# Patient Record
Sex: Male | Born: 1975 | Race: Black or African American | Hispanic: No | Marital: Married | Smoking: Current every day smoker
Health system: Southern US, Community
[De-identification: ages and names within clinical notes are randomized; demographics above are authoritative.]

## PROBLEM LIST (undated history)

## (undated) DIAGNOSIS — E7800 Pure hypercholesterolemia, unspecified: Secondary | ICD-10-CM

## (undated) DIAGNOSIS — E119 Type 2 diabetes mellitus without complications: Secondary | ICD-10-CM

---

## 2011-11-07 ENCOUNTER — Emergency Department (HOSPITAL_BASED_OUTPATIENT_CLINIC_OR_DEPARTMENT_OTHER)
Admission: EM | Admit: 2011-11-07 | Discharge: 2011-11-07 | Disposition: A | Discharge status: Home / Self Care | Payer: Medicaid Other | Attending: Emergency Medicine | Admitting: Emergency Medicine

## 2011-11-07 ENCOUNTER — Encounter (HOSPITAL_BASED_OUTPATIENT_CLINIC_OR_DEPARTMENT_OTHER): Payer: Self-pay | Admitting: *Deleted

## 2011-11-07 ENCOUNTER — Emergency Department (INDEPENDENT_AMBULATORY_CARE_PROVIDER_SITE_OTHER): Payer: Medicaid Other

## 2011-11-07 DIAGNOSIS — M545 Low back pain, unspecified: Secondary | ICD-10-CM | POA: Insufficient documentation

## 2011-11-07 DIAGNOSIS — IMO0001 Reserved for inherently not codable concepts without codable children: Secondary | ICD-10-CM | POA: Insufficient documentation

## 2011-11-07 DIAGNOSIS — M549 Dorsalgia, unspecified: Secondary | ICD-10-CM

## 2011-11-07 DIAGNOSIS — R109 Unspecified abdominal pain: Secondary | ICD-10-CM

## 2011-11-07 DIAGNOSIS — M533 Sacrococcygeal disorders, not elsewhere classified: Secondary | ICD-10-CM | POA: Insufficient documentation

## 2011-11-07 DIAGNOSIS — N509 Disorder of male genital organs, unspecified: Secondary | ICD-10-CM

## 2011-11-07 DIAGNOSIS — R209 Unspecified disturbances of skin sensation: Secondary | ICD-10-CM

## 2011-11-07 LAB — URINALYSIS, ROUTINE W REFLEX MICROSCOPIC
Leukocytes, UA: NEGATIVE
Nitrite: NEGATIVE
Specific Gravity, Urine: 1.024 (ref 1.005–1.030)
pH: 5.5 (ref 5.0–8.0)

## 2011-11-07 LAB — URINE MICROSCOPIC-ADD ON

## 2011-11-07 MED ORDER — IBUPROFEN 800 MG PO TABS
800.0000 mg | ORAL_TABLET | Freq: Once | ORAL | Status: AC
  Administered 2011-11-07: 800 mg via ORAL
  Filled 2011-11-07: qty 1

## 2011-11-07 NOTE — ED Notes (Signed)
Pt. Reports urinating more frequently and pain in the L lower quadrant and L lower back.  Pt. Also reports tingling in the scrotal area.

## 2011-11-07 NOTE — Discharge Instructions (Signed)
Back Pain, Adult Low back pain is very common. About 1 in 5 people have back pain.The cause of low back pain is rarely dangerous. The pain often gets better over time.About half of people with a sudden onset of back pain feel better in just 2 weeks. About 8 in 10 people feel better by 6 weeks.  CAUSES Some common causes of back pain include:  Strain of the muscles or ligaments supporting the spine.   Wear and tear (degeneration) of the spinal discs.   Arthritis.   Direct injury to the back.  DIAGNOSIS Most of the time, the direct cause of low back pain is not known.However, back pain can be treated effectively even when the exact cause of the pain is unknown.Answering your caregiver's questions about your overall health and symptoms is one of the most accurate ways to make sure the cause of your pain is not dangerous. If your caregiver needs more information, he or she may order lab work or imaging tests (X-rays or MRIs).However, even if imaging tests show changes in your back, this usually does not require surgery. HOME CARE INSTRUCTIONS For many people, back pain returns.Since low back pain is rarely dangerous, it is often a condition that people can learn to manageon their own.   Remain active. It is stressful on the back to sit or stand in one place. Do not sit, drive, or stand in one place for more than 30 minutes at a time. Take short walks on level surfaces as soon as pain allows.Try to increase the length of time you walk each day.   Do not stay in bed.Resting more than 1 or 2 days can delay your recovery.   Do not avoid exercise or work.Your body is made to move.It is not dangerous to be active, even though your back may hurt.Your back will likely heal faster if you return to being active before your pain is gone.   Pay attention to your body when you bend and lift. Many people have less discomfortwhen lifting if they bend their knees, keep the load close to their  bodies,and avoid twisting. Often, the most comfortable positions are those that put less stress on your recovering back.   Find a comfortable position to sleep. Use a firm mattress and lie on your side with your knees slightly bent. If you lie on your back, put a pillow under your knees.   Only take over-the-counter or prescription medicines as directed by your caregiver. Over-the-counter medicines to reduce pain and inflammation are often the most helpful.Your caregiver may prescribe muscle relaxant drugs.These medicines help dull your pain so you can more quickly return to your normal activities and healthy exercise.   Put ice on the injured area.   Put ice in a plastic bag.   Place a towel between your skin and the bag.   Leave the ice on for 15 to 20 minutes, 3 to 4 times a day for the first 2 to 3 days. After that, ice and heat may be alternated to reduce pain and spasms.   Ask your caregiver about trying back exercises and gentle massage. This may be of some benefit.   Avoid feeling anxious or stressed.Stress increases muscle tension and can worsen back pain.It is important to recognize when you are anxious or stressed and learn ways to manage it.Exercise is a great option.  SEEK MEDICAL CARE IF:  You have pain that is not relieved with rest or medicine.   You have   pain that does not improve in 1 week.   You have new symptoms.   You are generally not feeling well.  SEEK IMMEDIATE MEDICAL CARE IF:   You have pain that radiates from your back into your legs.   You develop new bowel or bladder control problems.   You have unusual weakness or numbness in your arms or legs.   You develop nausea or vomiting.   You develop abdominal pain.   You feel faint.  Document Released: 07/24/2005 Document Revised: 07/13/2011 Document Reviewed: 12/12/2010 ExitCare Patient Information 2012 [employer].     RESOURCE GUIDE  Dental Problems  Patients with  Medicaid: [facility]                     [facility] [address]                                           [address] Phone:  [phone]                                                  Phone:  [phone]  If unable to pay or uninsured, contact:  [facility] or [facility] to become qualified for the adult dental clinic.  Chronic Pain Problems Contact [facility]  [phone] Patients need to be referred by their primary care doctor.  Insufficient Money for Medicine Contact [employer]:  call "211" or [facility] 271-5999.  No Primary Care Doctor Call [facility]  832-8000 Other agencies that provide inexpensive medical care    [facility]  832-8035    [facility]  832-7272    [facility]  [phone]    Women[facility] Clinic  832-4777    [facility]  [phone]    [facility]  [phone]  [facility]ological Services [facility]  832-9600 [facility]  [phone] [facility]   800 853-5163 (emergency services 641-4993)  Substance Abuse Resources [facility]  336-882-2125 [facility] 336-784-9470 [facility] 336-285-9073 [facility] 336-845-3988 Residential & Outpatient Substance Abuse Program  800-659-3381  Abuse/Neglect [place] Child Abuse Hotline (336) 641-3795 [place] [facility] Abuse Hotline 800-378-5315 (After Hours)  Emergency Shelter  [facility] (336) 271-5985  [facility] (336) 275-9566 [facility] (704) 372-4663  MRSA Hotline #:   832-7006    [place] Resources  [facility] [place]     [employer]                          [facility] [address] [place]                       [address]      [address]  [place]                                                   [place]                            [place] Phone:  [phone]                                   Phone:  [phone]                 Phone:  [phone]  [facility] Phone:  [phone]  [place] Child Abuse Hotline (336) 342-1394 (336) 342-3537 (After Hours)   

## 2011-11-08 NOTE — ED Provider Notes (Signed)
History     CSN: [id number]  Arrival date & time 11/07/11  2058   First MD Initiated Contact with Patient 11/07/11 2131      Chief Complaint  Patient presents with  . Back Pain    For 3 days Pt. reports pain and tingling in the scrotal area.  Pt. reports the back pain in on the R side lower and into the R flank area.    (Consider location/radiation/quality/duration/timing/severity/associated sxs/prior treatment) HPI Comments: R lower back pain x 2months that has been intermittent.  NO injury.  No radiation of pain.  Occasionaly feels "tingling" in testicles, none at this time.  No fever, vomiting, dysuria, hematuria. No weakness, numbness, tingling, bowel or bladder incontinence. No abdominal pain.  Patient is a 36 y.o. male presenting with back pain.  Back Pain  This is a chronic problem. The current episode started more than 1 week ago. The problem occurs constantly. The problem has not changed since onset.The pain is associated with no known injury. The pain is present in the sacro-iliac joint and gluteal region. The quality of the pain is described as aching. The pain does not radiate. The pain is moderate. The symptoms are aggravated by bending and twisting. The pain is the same all the time. Associated symptoms include numbness and tingling. Pertinent negatives include no chest pain, no fever, no headaches, no abdominal pain, no bowel incontinence, no perianal numbness, no bladder incontinence and no weakness. He has tried NSAIDs for the symptoms. Risk factors include obesity, lack of exercise and a sedentary lifestyle.    History reviewed. No pertinent past medical history.  History reviewed. No pertinent past surgical history.  No family history on file.  History  Substance Use Topics  . Smoking status: Former Games developer  . Smokeless tobacco: Not on file  . Alcohol Use: No      Review of Systems  Constitutional: Negative for fever and appetite change.  HENT: Negative for  rhinorrhea.   Respiratory: Negative for cough.   Cardiovascular: Negative for chest pain.  Gastrointestinal: Negative for nausea, vomiting, abdominal pain and bowel incontinence.  Genitourinary: Positive for flank pain. Negative for bladder incontinence, discharge and testicular pain.  Musculoskeletal: Positive for back pain.  Neurological: Positive for tingling and numbness. Negative for weakness and headaches.    Allergies  Review of patient's allergies indicates no known allergies.  Home Medications   Current Outpatient Rx  Name Route Sig Dispense Refill  . IBUPROFEN 200 MG PO TABS Oral Take 1,200 mg by mouth once as needed. For pain      BP 148/87  Pulse 83  Temp(Src) 98.1 F (36.7 C) (Oral)  Resp 20  Ht 5\' 7"  (1.702 m)  Wt 380 lb (172.367 kg)  BMI 59.52 kg/m2  SpO2 98%  Physical Exam  Constitutional: He is oriented to person, place, and time. He appears well-developed and well-nourished. No distress.       Morbidly obese  HENT:  Head: Normocephalic and atraumatic.  Mouth/Throat: Oropharynx is clear and moist. No oropharyngeal exudate.  Eyes: Conjunctivae are normal. Pupils are equal, round, and reactive to light.  Neck: Normal range of motion.  Cardiovascular: Normal rate and normal heart sounds.   Pulmonary/Chest: Effort normal and breath sounds normal. No respiratory distress.       Diminished breath sounds due to body habitus  Abdominal: Soft. There is no tenderness. There is no rebound and no guarding.  Genitourinary: Penis normal.       No  testicular pain  Musculoskeletal: He exhibits tenderness.       R SI tenderness and paraspinal lumbar tenderness  Neurological: He is alert and oriented to person, place, and time. No cranial nerve deficit.       5/5 strength in bilateral lower extremities.  +2 patellar reflexes.  Ankle dorsi and plantar flexion intact.  Great toes dorsiflexion intact bilatereally.  Skin: Skin is warm.    ED Course  Procedures (including  critical care time)  Labs Reviewed  URINALYSIS, ROUTINE W REFLEX MICROSCOPIC - Abnormal; Notable for the following:    Hgb urine dipstick TRACE (*)    All other components within normal limits  URINE MICROSCOPIC-ADD ON - Abnormal; Notable for the following:    Bacteria, UA FEW (*)    All other components within normal limits   Ct Abdomen Pelvis Wo Contrast  11/07/2011  *RADIOLOGY REPORT*  Clinical Data: Back and right flank pain.  CT ABDOMEN AND PELVIS WITHOUT CONTRAST  Technique:  Multidetector CT imaging of the abdomen and pelvis was performed following the standard protocol without intravenous contrast.  Comparison: None  Findings: The lung bases are clear.  No pleural effusion.  Examination is limited by morbid obesity.  There is severe diffuse fatty infiltration of the liver with minimal fatty sparing around the gallbladder. No obvious hepatic lesion or intrahepatic biliary dilatation.  The gallbladder is grossly normal.  No obvious common bile duct dilatation.  The pancreas is grossly normal.  The spleen is normal in size.  No definite lesions.  There is a small right adrenal gland lesion which measures 8 HU and is consistent with a benign adenoma.  No renal or obstructing ureteral calculi.  No obvious renal mass.  The stomach, duodenum, small bowel and colon are grossly normal without oral contrast.  No obvious inflammatory changes or mass lesions.  No mesenteric or retroperitoneal mass or adenopathy.  The aorta is normal in caliber.  The bladder, prostate gland and seminal vesicles are unremarkable. No inguinal mass or adenopathy.  The bony pelvis is intact.  The pubic symphysis and SI joints are intact.  The lumbar vertebral bodies are normally aligned.  The facets are maintained.  No pars defects.  IMPRESSION:  No acute abdominal/pelvic findings.  No renal or obstructing ureteral calculi.  Original Report Authenticated By: P. Loralie Champagne, M.D.     1. Back pain       MDM  Chronic back  pain without neurological deficit.  Occasional testicular "tingling".  Normal testicular exam. No evidence of torsion.  UA with trace hb.  No kidney stones seen on CT. Supportive care for back pain.  Establish care with PCP.       Glynn Octave, MD 11/08/11 1030

## 2016-11-19 ENCOUNTER — Encounter (HOSPITAL_BASED_OUTPATIENT_CLINIC_OR_DEPARTMENT_OTHER): Payer: Self-pay | Admitting: *Deleted

## 2016-11-19 ENCOUNTER — Emergency Department (HOSPITAL_BASED_OUTPATIENT_CLINIC_OR_DEPARTMENT_OTHER)
Admission: EM | Admit: 2016-11-19 | Discharge: 2016-11-19 | Disposition: A | Discharge status: Home / Self Care | Payer: Medicaid Other | Attending: Emergency Medicine | Admitting: Emergency Medicine

## 2016-11-19 ENCOUNTER — Emergency Department (HOSPITAL_BASED_OUTPATIENT_CLINIC_OR_DEPARTMENT_OTHER): Payer: Medicaid Other

## 2016-11-19 DIAGNOSIS — R062 Wheezing: Secondary | ICD-10-CM | POA: Insufficient documentation

## 2016-11-19 DIAGNOSIS — Z5321 Procedure and treatment not carried out due to patient leaving prior to being seen by health care provider: Secondary | ICD-10-CM | POA: Diagnosis not present

## 2016-11-19 DIAGNOSIS — R05 Cough: Secondary | ICD-10-CM | POA: Diagnosis not present

## 2016-11-19 DIAGNOSIS — R0602 Shortness of breath: Secondary | ICD-10-CM | POA: Diagnosis not present

## 2016-11-19 DIAGNOSIS — R0981 Nasal congestion: Secondary | ICD-10-CM | POA: Insufficient documentation

## 2016-11-19 DIAGNOSIS — J029 Acute pharyngitis, unspecified: Secondary | ICD-10-CM | POA: Insufficient documentation

## 2016-11-19 DIAGNOSIS — F17200 Nicotine dependence, unspecified, uncomplicated: Secondary | ICD-10-CM | POA: Diagnosis not present

## 2016-11-19 DIAGNOSIS — R059 Cough, unspecified: Secondary | ICD-10-CM

## 2016-11-19 HISTORY — DX: Pure hypercholesterolemia, unspecified: E78.00

## 2016-11-19 NOTE — ED Notes (Signed)
Pt walked out of room after doctor left. Pt looks irritated and left without paperwork. RN notified.

## 2016-11-19 NOTE — ED Triage Notes (Signed)
c/o URI "cold" sx, NP cough, nasal and chest congestion, sore throat, sob and wheezing. Onset 5d ago. Mentions possible subjective fever (denies: nvd, dizziness, bleeding). Has taken some theraflu.

## 2019-03-04 ENCOUNTER — Ambulatory Visit: Payer: Medicaid Other | Attending: Physical Therapy | Admitting: Physical Therapy

## 2019-03-10 ENCOUNTER — Other Ambulatory Visit: Payer: Self-pay

## 2019-03-10 ENCOUNTER — Encounter: Payer: Self-pay | Admitting: Physical Therapy

## 2019-03-10 ENCOUNTER — Ambulatory Visit: Payer: Medicaid Other | Attending: Anesthesiology | Admitting: Physical Therapy

## 2019-03-10 DIAGNOSIS — M5441 Lumbago with sciatica, right side: Secondary | ICD-10-CM | POA: Insufficient documentation

## 2019-03-10 DIAGNOSIS — R262 Difficulty in walking, not elsewhere classified: Secondary | ICD-10-CM | POA: Insufficient documentation

## 2019-03-10 DIAGNOSIS — M5416 Radiculopathy, lumbar region: Secondary | ICD-10-CM | POA: Insufficient documentation

## 2019-03-10 DIAGNOSIS — M6281 Muscle weakness (generalized): Secondary | ICD-10-CM | POA: Insufficient documentation

## 2019-03-10 DIAGNOSIS — M62830 Muscle spasm of back: Secondary | ICD-10-CM | POA: Insufficient documentation

## 2019-03-10 DIAGNOSIS — G8929 Other chronic pain: Secondary | ICD-10-CM | POA: Diagnosis present

## 2019-03-10 NOTE — Therapy (Signed)
[facility] [address]  [address] [place], [place], [address] Phone: [phone]   Fax:  [phone]  Physical Therapy Evaluation  Patient Details  Name: Christopher BullionKevin Poole MRN: [id number] Date of Birth: 03-Nov-1975 Referring Provider (PT): Peggye Pittavid O'Toole, MD   Encounter Date: 03/10/2019  PT End of Session - 03/10/19 1503    Visit Number  1    Number of Visits  16    Date for PT Re-Evaluation  05/12/19    Authorization Type  Medicaid    PT Start Time  1503   Pt arrived late   PT Stop Time  1538    PT Time Calculation (min)  35 min    Activity Tolerance  Patient limited by pain    Behavior During Therapy  [facility] for tasks assessed/performed       Past Medical History:  Diagnosis Date  . Hypercholesterolemia     History reviewed. No pertinent surgical history.  There were no vitals filed for this visit.   Subjective Assessment - 03/10/19 1506    Subjective  Pt states 'my back has been like this for a couple years". Per pt, previous imagining unremarkable, with most recent x-rays revealing mild degenerative disc disease at L1/L2 and L5/S1 and mild facet joint arthropathy in the lower lumbar spine. Pt also reports chronic R knee pain which he is not sure if it related to his back or separate. Back and R LE pain limiting ability to walk for exercise to lose weight.    Limitations  Sitting;Standing;Walking;House hold activities    How long can you sit comfortably?  30-40 minutes    How long can you stand comfortably?  10 minutes    How long can you walk comfortably?  7-8 minutes    Diagnostic tests  01/28/19 - lumbar x-ray: Mild degenerative disc disease at L1/L2 and L5/S1. Mild facet joint arthropathy in the lower lumbar spine.    Patient Stated Goals  "get all this pain out"    Currently in Pain?  Yes    Pain Score  6    up to 9/10 at worst   Pain Location  Back    Pain Orientation  Lower;Right    Pain Descriptors / Indicators   --   "cracking"   Pain Type  Chronic pain    Pain Radiating Towards  intermittent tingling down R leg to foot with numbness in toes    Pain Onset  More than a month ago    Pain Frequency  Constant    Aggravating Factors   bending over, prolonged walking    Pain Relieving Factors  nothing    Effect of Pain on Daily Activities  "I just go with the flow"         [facility] - 03/10/19 1503      Assessment   Medical Diagnosis  Lumbar spondylosis    Referring Provider (PT)  Peggye Pittavid O'Toole, MD    Next MD Visit  04/10/19    Prior Therapy  none      Balance Screen   Has the patient fallen in the past 6 months  No    Has the patient had a decrease in activity level because of a fear of falling?   Yes    Is the patient reluctant to leave their home because of a fear of falling?   No      Home Public house managernvironment   Living Environment  Private residence  Living Arrangements  Spouse/significant other    Type of Home  House    Home Access  Stairs to enter    Entrance Stairs-Number of Steps  1    Home Layout  One level      Prior Function   Level of Independence  Independent    Vocation  On disability    Leisure  fishing, walking to lose wieght      Cognition   Overall Cognitive Status  Within Functional Limits for tasks assessed      ROM / Strength   AROM / PROM / Strength  AROM;Strength      AROM   Overall AROM   Deficits;Due to pain    AROM Assessment Site  Lumbar    Lumbar Flexion  hands to mid shins - pain    Lumbar Extension  75% lmited - pain    Lumbar - Right Side Bend  hand to lateral knee - pain    Lumbar - Left Side Bend  hand to 2" above knee - pain    Lumbar - Right Rotation  <25% limited    Lumbar - Left Rotation  <25% limited      Strength   Strength Assessment Site  Hip;Knee;Ankle    Right/Left Hip  Right;Left    Right Hip Flexion  4+/5    Right Hip Extension  3+/5    Right Hip ABduction  4/5    Right Hip ADduction  3+/5    Left Hip Flexion  4+/5    Left  Hip Extension  4-/5    Left Hip ABduction  4/5    Left Hip ADduction  4-/5    Right/Left Knee  Right;Left    Right Knee Flexion  4+/5    Right Knee Extension  5/5    Left Knee Flexion  4+/5    Left Knee Extension  5/5    Right/Left Ankle  Right;Left    Right Ankle Dorsiflexion  4+/5    Right Ankle Plantar Flexion  4/5    Left Ankle Dorsiflexion  5/5    Left Ankle Plantar Flexion  4/5      Flexibility   Soft Tissue Assessment /Muscle Length  yes    Hamstrings  mod/severe tight    Quadriceps  mod tight    ITB  mod tight    Piriformis  mod tight                Objective measurements completed on examination: See above findings.                PT Short Term Goals - 03/10/19 1538      PT SHORT TERM GOAL #1   Title  Independent with initial HEP    Status  New    Target Date  03/31/19      PT SHORT TERM GOAL #2   Title  Patient will verbalize/demonstrate understanding of neutral spine posture and proper body mechanics to reduce strain on lumbar spine    Status  New    Target Date  03/31/19      PT SHORT TERM GOAL #3   Title  Patient will report >/= 25% reduction in average low back pain levels with decreasing frequency and intensity of radicular symptoms    Status  New    Target Date  03/31/19        PT Long Term Goals - 03/10/19 1538      PT LONG TERM GOAL #  1   Title  Independent with ongoing/advanced HEP    Status  New    Target Date  05/12/19      PT LONG TERM GOAL #2   Title  Lumbar AROM WFL w/o increased pain    Status  New    Target Date  05/12/19      PT LONG TERM GOAL #3   Title  B LE strength >/= 4+/5 without increased pain for improved stability    Status  New    Target Date  05/12/19      PT LONG TERM GOAL #4   Title  Patient will report ability to walk for >/= 30 minutes w/o low back or radicular LE pain provocation to promote increased activity for weight loss program    Status  New    Target Date  05/12/19              Plan - 03/10/19 1538    Clinical Impression Statement  Christopher Poole is a 43 y/o male who presents to OP PT with chronic low back pain with R LE radiculopathy from lumbar spondylosis dating back several years.  Recent imaging revealing mild degenerative disc disease at L1/L2 and L5/S1 and mild facet joint arthropathy in the lower lumbar spine. Lumbar ROM limited in all directions with pain for all motions except B rotation. Proximal LE flexibility demonstrating moderate to severe muscle tightness in all muscle groups, with tightness limiting ROM for full MMT. Mild to moderate strength deficits noted in B LEs (R>L) with greatest weakness proximally. Late arrival to appointment limiting time for palpation and special testing as well as initiation of HEP. Pain limits positional and activity tolerance, especially walking tolerance, which limits his ability to increase activity and/or exercise to promote weight loss (current BMI > 50). Ava will benefit from skilled PT to address pain and above deficits to allow for improved quality of life and increased activity with reduced pain.    Personal Factors and Comorbidities  Comorbidity 3+;Fitness;Time since onset of injury/illness/exacerbation;Past/Current Experience    Comorbidities  Morbid obesity (BMI > 50); chronic back pain - lumbar degenerative disc disease, spondylosis with radiculopathy (lumbar region), lumbar facet joint pain, lumbar foraminal stenosis; chronic myofascial pain/chronic pain disorder; chronic R knee pain; uncontrolled type 2 DM with hyperglycemia;    Examination-Activity Limitations  Bend;Lift;Sit;Squat;Locomotion Level    Examination-Participation Restrictions  Driving    Stability/Clinical Decision Making  Evolving/Moderate complexity    Clinical Decision Making  Moderate    Rehab Potential  Good    PT Frequency  1x / week   for initial Medicaid authorization, then potentially increase to 2x/wk   PT Duration  Other (comment)    9 weeks   PT Treatment/Interventions  Patient/family education;Neuromuscular re-education;Therapeutic exercise;Therapeutic activities;Functional mobility training;ADLs/Self Care Home Management;Manual techniques;Passive range of motion;Dry needling;Taping;Spinal Manipulations;Electrical Stimulation;Moist Heat;Ultrasound;Iontophoresis 4mg /ml Dexamethasone    PT Next Visit Plan  Provide initial HEP; posture & body mechanics education    Consulted and Agree with Plan of Care  Patient       Patient will benefit from skilled therapeutic intervention in order to improve the following deficits and impairments:  Pain, Increased muscle spasms, Impaired flexibility, Decreased range of motion, Decreased strength, Postural dysfunction, Improper body mechanics, Decreased activity tolerance, Obesity  Visit Diagnosis: 1. Chronic right-sided low back pain with right-sided sciatica   2. Radiculopathy, lumbar region   3. Muscle weakness (generalized)   4. Difficulty in walking, not elsewhere classified   5. Muscle  spasm of back        Problem List There are no active problems to display for this patient.   Marry GuanJoAnne M Daylon Lafavor, PT, MPT 03/10/2019, 7:10 PM  [facility] [place] [address]  [address] [place], [place], [address] Phone: [phone]   Fax:  [phone]  Name: Christopher BullionKevin Poole MRN: [id number] Date of Birth: Oct 26, 1975

## 2019-03-17 ENCOUNTER — Ambulatory Visit: Payer: Medicaid Other | Admitting: Physical Therapy

## 2019-03-24 ENCOUNTER — Other Ambulatory Visit: Payer: Self-pay

## 2019-03-24 ENCOUNTER — Encounter: Payer: Self-pay | Admitting: Physical Therapy

## 2019-03-24 ENCOUNTER — Ambulatory Visit: Payer: Medicaid Other | Admitting: Physical Therapy

## 2019-03-24 DIAGNOSIS — M6281 Muscle weakness (generalized): Secondary | ICD-10-CM

## 2019-03-24 DIAGNOSIS — G8929 Other chronic pain: Secondary | ICD-10-CM

## 2019-03-24 DIAGNOSIS — M5441 Lumbago with sciatica, right side: Secondary | ICD-10-CM | POA: Diagnosis not present

## 2019-03-24 DIAGNOSIS — R262 Difficulty in walking, not elsewhere classified: Secondary | ICD-10-CM

## 2019-03-24 DIAGNOSIS — M5416 Radiculopathy, lumbar region: Secondary | ICD-10-CM

## 2019-03-24 DIAGNOSIS — M62830 Muscle spasm of back: Secondary | ICD-10-CM

## 2019-03-24 NOTE — Therapy (Addendum)
[facility] [address]  [place], [place], [address] Phone: [phone]   Fax:  [phone]  Physical Therapy Treatment / Discharge Summary  Patient Details  Name: Christopher Poole MRN: [id number] Date of Birth: Sep 24, 1975 Referring Provider (PT): Dorene Ar, MD   Encounter Date: 03/24/2019  PT End of Session - 03/24/19 1452    Visit Number  2    Number of Visits  16    Date for PT Re-Evaluation  05/12/19    Authorization Type  Medicaid    Authorization Time Period  03/17/19 - 04/06/19    Authorization - Visit Number  1    Authorization - Number of Visits  3    PT Start Time  4536    PT Stop Time  1538    PT Time Calculation (min)  46 min    Activity Tolerance  Patient tolerated treatment well    Behavior During Therapy  [facility] for tasks assessed/performed       Past Medical History:  Diagnosis Date  . Hypercholesterolemia     History reviewed. No pertinent surgical history.  There were no vitals filed for this visit.  Subjective Assessment - 03/24/19 1502    Subjective  Pt reporting some increased soreness after testing on eval but resolved within a day or so.    Pertinent History  morbid obesityMorbid obesity (BMI > 50); chronic back pain - lumbar degenerative disc disease, spondylosis with radiculopathy (lumbar region), lumbar facet joint pain, lumbar foraminal stenosis; chronic myofascial pain/chronic pain disorder; chronic R knee pain; uncontrolled type 2 DM with hyperglycemia;    Limitations  Sitting;Standing;Walking;House hold activities    Diagnostic tests  01/28/19 - lumbar x-ray: Mild degenerative disc disease at L1/L2 and L5/S1. Mild facet joint arthropathy in the lower lumbar spine.    Patient Stated Goals  "get all this pain out"    Currently in Pain?  Yes    Pain Score  7    6-7/10   Pain Location  Back    Pain Orientation  Lower;Right    Pain Descriptors / Indicators  Sharp;Sore;Burning    Pain  Type  Chronic pain    Pain Radiating Towards  none currently                       [facility] Adult PT Treatment/Exercise - 03/24/19 1452      Exercises   Exercises  Lumbar      Lumbar Exercises: Stretches   Passive Hamstring Stretch  Right;Left;30 seconds;2 reps    Passive Hamstring Stretch Limitations  seated hip hinge & supine with strap - supine better tolerated due to increased R knee pain with seated stretch    Lower Trunk Rotation  10 seconds;5 reps    Hip Flexor Stretch  Right;Left;30 seconds;1 rep    Hip Flexor Stretch Limitations  seated at corner of mat table with leg extended back over edge of table    Piriformis Stretch  Right;Left;30 seconds;1 rep    Piriformis Stretch Limitations  KTOS with strap assist for reach    Figure 4 Stretch  20 seconds;1 rep;Without overpressure;Supine    Figure 4 Stretch Limitations  deferred d/t increased knee pain      Lumbar Exercises: Aerobic   Nustep  L3 x 6 min (LE only)      Lumbar Exercises: Supine   Ab Set  5 reps;5 seconds    AB Set Limitations  deferred  d/t repeated abdominal muscle cramping    Clam  10 reps;3 seconds    Clam Limitations  alt bent knee fall-out    Bent Knee Raise  10 reps;3 seconds    Bent Knee Raise Limitations  brace marching    Bridge  10 reps;3 seconds             PT Education - 03/24/19 1535    Education Details  Initial HEP    Person(s) Educated  Patient    Methods  Explanation;Demonstration;Handout    Comprehension  Verbalized understanding;Returned demonstration;Need further instruction       PT Short Term Goals - 03/24/19 1538      PT SHORT TERM GOAL #1   Title  Independent with initial HEP    Status  On-going    Target Date  03/31/19      PT SHORT TERM GOAL #2   Title  Patient will verbalize/demonstrate understanding of neutral spine posture and proper body mechanics to reduce strain on lumbar spine    Status  On-going    Target Date  03/31/19      PT SHORT TERM GOAL  #3   Title  Patient will report >/= 25% reduction in average low back pain levels with decreasing frequency and intensity of radicular symptoms    Status  On-going    Target Date  03/31/19        PT Long Term Goals - 03/24/19 1538      PT LONG TERM GOAL #1   Title  Independent with ongoing/advanced HEP    Status  On-going    Target Date  05/12/19      PT LONG TERM GOAL #2   Title  Lumbar AROM WFL w/o increased pain    Status  On-going    Target Date  05/12/19      PT LONG TERM GOAL #3   Title  B LE strength >/= 4+/5 without increased pain for improved stability    Status  On-going    Target Date  05/12/19      PT LONG TERM GOAL #4   Title  Patient will report ability to walk for >/= 30 minutes w/o low back or radicular LE pain provocation to promote increased activity for weight loss program    Status  On-going    Target Date  05/12/19            Plan - 03/24/19 1505    Clinical Impression Statement  Lennette Bihari reporting continued moderate LBP today but denies any radicular symptoms at present. Initiated instruction in HEP for stretching and lumbopelvic strengthening/stabilization. Limited tolerance for some exercises due to positional limitations from morbid obesity as well as R knee pain and abdominal muscle cramping but able to find versions of most exercises that patient able to tolerate well with patient expressing particular interest in bridges. Will plan for postural and body mechanics training on next visit.    Comorbidities  Morbid obesity (BMI > 50); chronic back pain - lumbar degenerative disc disease, spondylosis with radiculopathy (lumbar region), lumbar facet joint pain, lumbar foraminal stenosis; chronic myofascial pain/chronic pain disorder; chronic R knee pain; uncontrolled type 2 DM with hyperglycemia;    Rehab Potential  Good    PT Frequency  2x / week   for initial Medicaid authorization, then potentially increase to 2x/wk   PT Duration  --   9 weeks   PT  Treatment/Interventions  Patient/family education;Neuromuscular re-education;Therapeutic exercise;Therapeutic activities;Functional mobility training;ADLs/Self Care Home  Management;Manual techniques;Passive range of motion;Dry needling;Taping;Spinal Manipulations;Electrical Stimulation;Moist Heat;Ultrasound;Iontophoresis 56m/ml Dexamethasone    PT Next Visit Plan  Review initial HEP; posture & body mechanics education    Consulted and Agree with Plan of Care  Patient       Patient will benefit from skilled therapeutic intervention in order to improve the following deficits and impairments:  Pain, Increased muscle spasms, Impaired flexibility, Decreased range of motion, Decreased strength, Postural dysfunction, Improper body mechanics, Decreased activity tolerance, Obesity  Visit Diagnosis: 1. Chronic right-sided low back pain with right-sided sciatica   2. Radiculopathy, lumbar region   3. Muscle weakness (generalized)   4. Difficulty in walking, not elsewhere classified   5. Muscle spasm of back        Problem List There are no active problems to display for this patient.   JPercival Spanish PT, MPT 03/24/2019, 7:26 PM  [facility] [place] [address]Phone: [phone]  Fax:  [phone] Name: KSyon TewsMRN: [id number] of Birth: 109/29/1977  PHYSICAL THERAPY DISCHARGE SUMMARY  Visits from Start of Care: 2  Current functional level related to goals / functional outcomes:   Refer to above clinical impression for status as of last visit on 03/24/2019. Patient no showed for 2 appointments and cancelled the 3rd appointment, failing to return to PT prior to expiration of Medicaid authorization, therefore will proceed with discharge from PT for this episode. Patient aware that he will need a new referral in order to return to PT.   Remaining deficits:   As above - Unable to formally  assess as patient only returned for 1 treatment visit following the eval.   Education / Equipment:   Initial HEP  Plan: Patient agrees to discharge.  Patient goals were not met. Patient is being discharged due to not returning since the last visit.  ?????     JPercival Spanish PT, MPT 04/29/19, 10:19 AM  [facility] [place] [place] [address]Phone: [phone]  Fax:  [phone]

## 2019-03-27 ENCOUNTER — Ambulatory Visit: Payer: Medicaid Other | Admitting: Physical Therapy

## 2019-03-31 ENCOUNTER — Ambulatory Visit: Payer: Medicaid Other | Admitting: Physical Therapy

## 2019-04-07 ENCOUNTER — Ambulatory Visit: Payer: Medicaid Other | Admitting: Physical Therapy

## 2022-01-04 ENCOUNTER — Other Ambulatory Visit: Payer: Self-pay

## 2022-01-04 ENCOUNTER — Encounter (HOSPITAL_BASED_OUTPATIENT_CLINIC_OR_DEPARTMENT_OTHER): Payer: Self-pay

## 2022-01-04 ENCOUNTER — Emergency Department (HOSPITAL_BASED_OUTPATIENT_CLINIC_OR_DEPARTMENT_OTHER): Payer: Medicaid Other

## 2022-01-04 ENCOUNTER — Emergency Department (HOSPITAL_BASED_OUTPATIENT_CLINIC_OR_DEPARTMENT_OTHER)
Admission: EM | Admit: 2022-01-04 | Discharge: 2022-01-04 | Disposition: A | Discharge status: Home / Self Care | Payer: Medicaid Other | Attending: Emergency Medicine | Admitting: Emergency Medicine

## 2022-01-04 DIAGNOSIS — F17210 Nicotine dependence, cigarettes, uncomplicated: Secondary | ICD-10-CM | POA: Diagnosis not present

## 2022-01-04 DIAGNOSIS — Z20822 Contact with and (suspected) exposure to covid-19: Secondary | ICD-10-CM | POA: Insufficient documentation

## 2022-01-04 DIAGNOSIS — J101 Influenza due to other identified influenza virus with other respiratory manifestations: Secondary | ICD-10-CM | POA: Diagnosis not present

## 2022-01-04 DIAGNOSIS — E119 Type 2 diabetes mellitus without complications: Secondary | ICD-10-CM | POA: Diagnosis not present

## 2022-01-04 DIAGNOSIS — J069 Acute upper respiratory infection, unspecified: Secondary | ICD-10-CM

## 2022-01-04 DIAGNOSIS — R519 Headache, unspecified: Secondary | ICD-10-CM | POA: Diagnosis present

## 2022-01-04 HISTORY — DX: Type 2 diabetes mellitus without complications: E11.9

## 2022-01-04 LAB — RESP PANEL BY RT-PCR (FLU A&B, COVID) ARPGX2
Influenza A by PCR: NEGATIVE
Influenza B by PCR: POSITIVE — AB
SARS Coronavirus 2 by RT PCR: NEGATIVE

## 2022-01-04 LAB — SARS CORONAVIRUS 2 BY RT PCR: SARS Coronavirus 2 by RT PCR: NEGATIVE

## 2022-01-04 MED ORDER — BENZONATATE 100 MG PO CAPS: 100.0000 mg | ORAL_CAPSULE | Freq: Three times a day (TID) | ORAL | 0 refills | Status: AC

## 2022-01-04 MED ORDER — ALBUTEROL SULFATE HFA 108 (90 BASE) MCG/ACT IN AERS
2.0000 | INHALATION_SPRAY | RESPIRATORY_TRACT | Status: DC | PRN
  Administered 2022-01-04: 2 via RESPIRATORY_TRACT
  Filled 2022-01-04: qty 6.7

## 2022-01-04 NOTE — ED Triage Notes (Signed)
Pt presents with 2 day hx of HA, body aches, cough, and sweating.

## 2022-01-04 NOTE — ED Notes (Signed)
Christopher Poole in lab states he will run the 4-plex (covid, Flu A & B) off of the previously collected swab

## 2022-01-04 NOTE — ED Provider Notes (Signed)
[facility] Provider Note   CSN: [id number] Arrival date & time: 01/04/22  1545     History  Chief Complaint  Patient presents with   Headache    [facility] Poole is a 46 y.o. male.   Headache Associated symptoms: cough and myalgias   Associated symptoms: no abdominal pain, no back pain, no congestion, no diarrhea, no dizziness, no drainage, no ear pain, no eye pain, no fever, no nausea, no neck pain, no neck stiffness, no numbness, no seizures, no sinus pressure, no sore throat, no vomiting and no weakness    46 year old male presents emergency department with 2-day history of cough, body aches, headache associated with excessive coughing.  Patient states that symptoms began abruptly 2 days ago and have been persistently worse prompting his visit to the emergency department today.  He has tried ibuprofen as well as DayQuil with minimal to no symptom relief.  He denies any other family member is experiencing the same symptoms.  He denies tick bite, recently being outdoors, or observable rash.  He denies recent travel.  He denies fever, chills, night sweats, chest pain, shortness of breath, abdominal pain, nausea/vomiting/diarrhea, urinary symptoms, change in bowel habits, visual disturbance, gait abnormality, weakness, sensory deficits.  Past medical history significant for diabetes mellitus, chronic cigarette smoker, hypercholesterol anemia  Home Medications Prior to Admission medications   Medication Sig Start Date End Date Taking? Authorizing Provider  benzonatate (TESSALON) 100 MG capsule Take 1 capsule (100 mg total) by mouth every 8 (eight) hours. 01/04/22  Yes Dion Saucier A, PA  ibuprofen (ADVIL,MOTRIN) 200 MG tablet Take 1,200 mg by mouth once as needed. For pain    [provider]      Allergies    Patient has no known allergies.    Review of Systems   Review of Systems  Constitutional:  Negative for chills and fever.  HENT:   Negative for congestion, ear pain, postnasal drip, rhinorrhea, sinus pressure, sinus pain and sore throat.   Eyes:  Negative for pain, discharge and visual disturbance.  Respiratory:  Positive for cough. Negative for shortness of breath and wheezing.   Cardiovascular:  Negative for chest pain, palpitations and leg swelling.  Gastrointestinal:  Negative for abdominal pain, blood in stool, diarrhea, nausea and vomiting.  Endocrine: Negative for polyuria.  Genitourinary:  Negative for decreased urine volume, dysuria, flank pain, frequency, hematuria and urgency.  Musculoskeletal:  Positive for arthralgias and myalgias. Negative for back pain, neck pain and neck stiffness.       Generalized myalgias/arthralgias.   Skin:  Negative for color change and rash.  Neurological:  Positive for headaches. Negative for dizziness, seizures, syncope, weakness, light-headedness and numbness.  All other systems reviewed and are negative.  Physical Exam Updated Vital Signs BP 123/76 (BP Location: Right Arm)   Pulse 90   Temp 98.6 F (37 C) (Oral)   Resp 20   Ht 5' 7.5" (1.715 m)   Wt (!) 149.3 kg   SpO2 97%   BMI 50.78 kg/m  Physical Exam Vitals and nursing note reviewed.  Constitutional:      General: He is not in acute distress.    Appearance: Normal appearance. He is well-developed. He is obese. He is not ill-appearing, toxic-appearing or diaphoretic.  HENT:     Head: Normocephalic and atraumatic.     Nose: Nose normal.     Mouth/Throat:     Mouth: Mucous membranes are moist.     Pharynx: Oropharynx  is clear. No oropharyngeal exudate or posterior oropharyngeal erythema.  Eyes:     General: No visual field deficit.       Right eye: No discharge.        Left eye: No discharge.     Extraocular Movements: Extraocular movements intact.     Conjunctiva/sclera: Conjunctivae normal.  Cardiovascular:     Rate and Rhythm: Normal rate and regular rhythm.     Pulses: Normal pulses.     Heart sounds:  Normal heart sounds.  Pulmonary:     Effort: Pulmonary effort is normal. No respiratory distress.     Breath sounds: Wheezing present. No rales.     Comments: Patient noted chronic wheeze.  Abdominal:     General: Abdomen is flat. Bowel sounds are normal.     Palpations: Abdomen is soft.     Tenderness: There is no abdominal tenderness. There is no right CVA tenderness, left CVA tenderness or guarding.  Musculoskeletal:        General: No swelling or tenderness. Normal range of motion.     Cervical back: Normal range of motion and neck supple. No rigidity or tenderness.     Right lower leg: No edema.     Left lower leg: No edema.  Skin:    General: Skin is warm and dry.     Capillary Refill: Capillary refill takes less than 2 seconds.  Neurological:     General: No focal deficit present.     Mental Status: He is alert and oriented to person, place, and time.     GCS: GCS eye subscore is 4. GCS verbal subscore is 5. GCS motor subscore is 6.     Cranial Nerves: No dysarthria or facial asymmetry.     Sensory: No sensory deficit.     Motor: No weakness.     Gait: Gait normal.  Psychiatric:        Mood and Affect: Mood normal.        Behavior: Behavior normal.    ED Results / Procedures / Treatments   Labs (all labs ordered are listed, but only abnormal results are displayed) Labs Reviewed  RESP PANEL BY RT-PCR (FLU A&B, COVID) ARPGX2 - Abnormal; Notable for the following components:      Result Value   Influenza B by PCR POSITIVE (*)    All other components within normal limits  SARS CORONAVIRUS 2 BY RT PCR    EKG None  Radiology DG Chest 2 View  Result Date: 01/04/2022 CLINICAL DATA:  Cough. EXAM: CHEST - 2 VIEW COMPARISON:  06/17/2021 FINDINGS: The heart size and mediastinal contours are within normal limits. There is no evidence of pulmonary edema, consolidation, pneumothorax, nodule or pleural fluid. The visualized skeletal structures are unremarkable. IMPRESSION: No  active cardiopulmonary disease. Electronically Signed   By: Aletta Edouard M.D.   On: 01/04/2022 16:58    Procedures Procedures    Medications Ordered in ED Medications - No data to display   ED Course/ Medical Decision Making/ A&P Clinical Course as of 01/05/22 1225  Wed Jan 04, 2022  1729 Patient requested to be tested for influenza. [CR]    Clinical Course User Index [CR] Wilnette Kales, PA                           Medical Decision Making Amount and/or Complexity of Data Reviewed Radiology: ordered.  Risk Prescription drug management.   This patient presents  to the ED for concern of headache/myalgias/cough, this involves an extensive number of treatment options, and is a complaint that carries with it a high risk of complications and morbidity.  The differential diagnosis includes influenza, covid, lyme disease, pneumonia, sepsis,    Co morbidities that complicate the patient evaluation  diabetes mellitus, chronic cigarette smoker, hypercholesterol anemia    Lab Tests:  I Ordered, and personally interpreted labs.  The pertinent results include:  Positive influenza B   Imaging Studies ordered:  I ordered imaging studies including chest x-ray  I independently visualized and interpreted imaging which showed no acute cardiopulmonary abnormality I agree with the radiologist interpretation   Cardiac Monitoring: / EKG:  The patient was maintained on a cardiac monitor.  I personally viewed and interpreted the cardiac monitored which showed an underlying rhythm of: sinus rhythm   Consultations Obtained:  N/a   Problem List / ED Course / Critical interventions / Medication management   I ordered medication including albuterol with spacer for wheeze  Reevaluation of the patient after these medicines showed that the patient improved I have reviewed the patients home medicines and have made adjustments as needed   Social Determinants of Health:  Chronic  cigarette use. Denies illicit drug use   Test / Admission - Considered:  Influenza B Vitals signs within normal range and stable throughout visit Laboratory/imaging studies significant for: positive influenza B Patient discharged before results of viral resp swab were uploaded. He was advised to check his mychart for results. He was also recommended to check his entire body for potential tick exposure given the time of year of influenza was negative. Close follow up with PCP regarding symptoms if negative results was recommended.  F/u with PCP also recommended for better management of inhaler therapy. Spacer was provided to ensure proper administration of inhaler was being preformed. 5-10 minutes was spent regarding smoking cessation.  Patient deemed safe for discharge given he was not hypoxic nor in respiratory distress.  Worrisome signs and symptoms were discussed with the patient, and the patient acknowledged understanding to return to the ED if noticed. Patient was stable upon discharge.          Final Clinical Impression(s) / ED Diagnoses Final diagnoses:  Influenza B    Rx / DC Orders ED Discharge Orders          Ordered    benzonatate (TESSALON) 100 MG capsule  Every 8 hours        01/04/22 1728              Wilnette Kales, [place] [id number]    [place], [place] [id number]

## 2022-01-04 NOTE — Discharge Instructions (Addendum)
Take Tylenol/ibuprofen over-the-counter as needed for muscle aches/joint pain.  If you develop a fever, they will also treat that as well.  Continue take your inhalers as prescribed.  Consider following up with your PCP for further adjustment of your inhaler therapy because you are still actively wheezing today.  Consider using the spacer provided to ensure that adequate medicine is being administered while using inhaler.  Follow MyChart for results on influenza.  Continue oral hydration with water and electrolyte rich fluids such as Pedialyte, sugar-free Gatorade.  I will prescribe medicine to help with excessive coughing.  Take as needed.  Check body for potential tick bites; they can cause the symptoms in the summertime.  If you find 1, follow-up with your PCP, urgent care, or emergency department for further treatment. Please do not hesitate to return to the emergency department if the worrisome signs and symptoms we discussed become apparent.

## 2022-09-02 ENCOUNTER — Emergency Department (HOSPITAL_BASED_OUTPATIENT_CLINIC_OR_DEPARTMENT_OTHER): Payer: Medicaid Other

## 2022-09-02 ENCOUNTER — Other Ambulatory Visit: Payer: Self-pay

## 2022-09-02 ENCOUNTER — Emergency Department (HOSPITAL_BASED_OUTPATIENT_CLINIC_OR_DEPARTMENT_OTHER)
Admission: EM | Admit: 2022-09-02 | Discharge: 2022-09-03 | Disposition: A | Discharge status: Home / Self Care | Payer: No Typology Code available for payment source | Attending: Emergency Medicine | Admitting: Emergency Medicine

## 2022-09-02 ENCOUNTER — Encounter (HOSPITAL_BASED_OUTPATIENT_CLINIC_OR_DEPARTMENT_OTHER): Payer: Self-pay

## 2022-09-02 DIAGNOSIS — Y92410 Unspecified street and highway as the place of occurrence of the external cause: Secondary | ICD-10-CM | POA: Insufficient documentation

## 2022-09-02 DIAGNOSIS — S301XXA Contusion of abdominal wall, initial encounter: Secondary | ICD-10-CM | POA: Insufficient documentation

## 2022-09-02 DIAGNOSIS — S0990XA Unspecified injury of head, initial encounter: Secondary | ICD-10-CM | POA: Diagnosis not present

## 2022-09-02 DIAGNOSIS — S20219A Contusion of unspecified front wall of thorax, initial encounter: Secondary | ICD-10-CM | POA: Insufficient documentation

## 2022-09-02 DIAGNOSIS — R0789 Other chest pain: Secondary | ICD-10-CM | POA: Diagnosis not present

## 2022-09-02 DIAGNOSIS — M545 Low back pain, unspecified: Secondary | ICD-10-CM | POA: Diagnosis not present

## 2022-09-02 DIAGNOSIS — S20309A Unspecified superficial injuries of unspecified front wall of thorax, initial encounter: Secondary | ICD-10-CM | POA: Diagnosis present

## 2022-09-02 DIAGNOSIS — M25572 Pain in left ankle and joints of left foot: Secondary | ICD-10-CM | POA: Insufficient documentation

## 2022-09-02 DIAGNOSIS — R58 Hemorrhage, not elsewhere classified: Secondary | ICD-10-CM

## 2022-09-02 DIAGNOSIS — M79672 Pain in left foot: Secondary | ICD-10-CM | POA: Insufficient documentation

## 2022-09-02 LAB — CBC WITH DIFFERENTIAL/PLATELET
Abs Immature Granulocytes: 0.03 10*3/uL (ref 0.00–0.07)
Basophils Absolute: 0 10*3/uL (ref 0.0–0.1)
Basophils Relative: 1 %
Eosinophils Absolute: 0.1 10*3/uL (ref 0.0–0.5)
Eosinophils Relative: 2 %
HCT: 43.4 % (ref 39.0–52.0)
Hemoglobin: 14.4 g/dL (ref 13.0–17.0)
Immature Granulocytes: 1 %
Lymphocytes Relative: 49 %
Lymphs Abs: 3.3 10*3/uL (ref 0.7–4.0)
MCH: 29.1 pg (ref 26.0–34.0)
MCHC: 33.2 g/dL (ref 30.0–36.0)
MCV: 87.7 fL (ref 80.0–100.0)
Monocytes Absolute: 0.7 10*3/uL (ref 0.1–1.0)
Monocytes Relative: 11 %
Neutro Abs: 2.3 10*3/uL (ref 1.7–7.7)
Neutrophils Relative %: 36 %
Platelets: 165 10*3/uL (ref 150–400)
RBC: 4.95 MIL/uL (ref 4.22–5.81)
RDW: 13.3 % (ref 11.5–15.5)
WBC: 6.5 10*3/uL (ref 4.0–10.5)
nRBC: 0 % (ref 0.0–0.2)

## 2022-09-02 LAB — COMPREHENSIVE METABOLIC PANEL
ALT: 24 U/L (ref 0–44)
AST: 21 U/L (ref 15–41)
Albumin: 4.1 g/dL (ref 3.5–5.0)
Alkaline Phosphatase: 68 U/L (ref 38–126)
Anion gap: 7 (ref 5–15)
BUN: 12 mg/dL (ref 6–20)
CO2: 25 mmol/L (ref 22–32)
Calcium: 9 mg/dL (ref 8.9–10.3)
Chloride: 106 mmol/L (ref 98–111)
Creatinine, Ser: 0.92 mg/dL (ref 0.61–1.24)
GFR, Estimated: >60 mL/min (ref >60)
Glucose, Bld: 91 mg/dL (ref 70–99)
Potassium: 4.2 mmol/L (ref 3.5–5.1)
Sodium: 138 mmol/L (ref 135–145)
Total Bilirubin: 0.4 mg/dL (ref 0.3–1.2)
Total Protein: 7.6 g/dL (ref 6.5–8.1)

## 2022-09-02 MED ORDER — MORPHINE SULFATE (PF) 4 MG/ML IV SOLN
4.0000 mg | Freq: Once | INTRAVENOUS | Status: AC
  Administered 2022-09-02: 4 mg via INTRAVENOUS
  Filled 2022-09-02: qty 1
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
## 2022-09-03 DIAGNOSIS — S20219A Contusion of unspecified front wall of thorax, initial encounter: Secondary | ICD-10-CM | POA: Diagnosis not present

## 2022-09-03 MED ORDER — METHOCARBAMOL 500 MG PO TABS: 500.0000 mg | ORAL_TABLET | Freq: Two times a day (BID) | ORAL | 0 refills | Status: DC

## 2022-09-03 MED ORDER — NAPROXEN 500 MG PO TABS: 500.0000 mg | ORAL_TABLET | Freq: Two times a day (BID) | ORAL | 0 refills | Status: AC

## 2022-09-03 MED ORDER — IOHEXOL 300 MG/ML  SOLN
100.0000 mL | Freq: Once | INTRAMUSCULAR | Status: AC | PRN
  Administered 2022-09-03: 100 mL via INTRAVENOUS

## 2022-09-03 NOTE — Discharge Instructions (Signed)
I am sending you home with pain medication and a muscle relaxer.  Take as needed for pain.  Follow-up with PCP if symptoms do not improve over the next few days.  Return to the ER for any worsening symptoms.

## 2022-10-23 IMAGING — CR DG CHEST 2V
2 series · 2 of 2 positions shown · non-contrast
Comparison: 06/17/2021

CLINICAL DATA: Cough.

EXAM:
CHEST - 2 VIEW

[w chest pa]
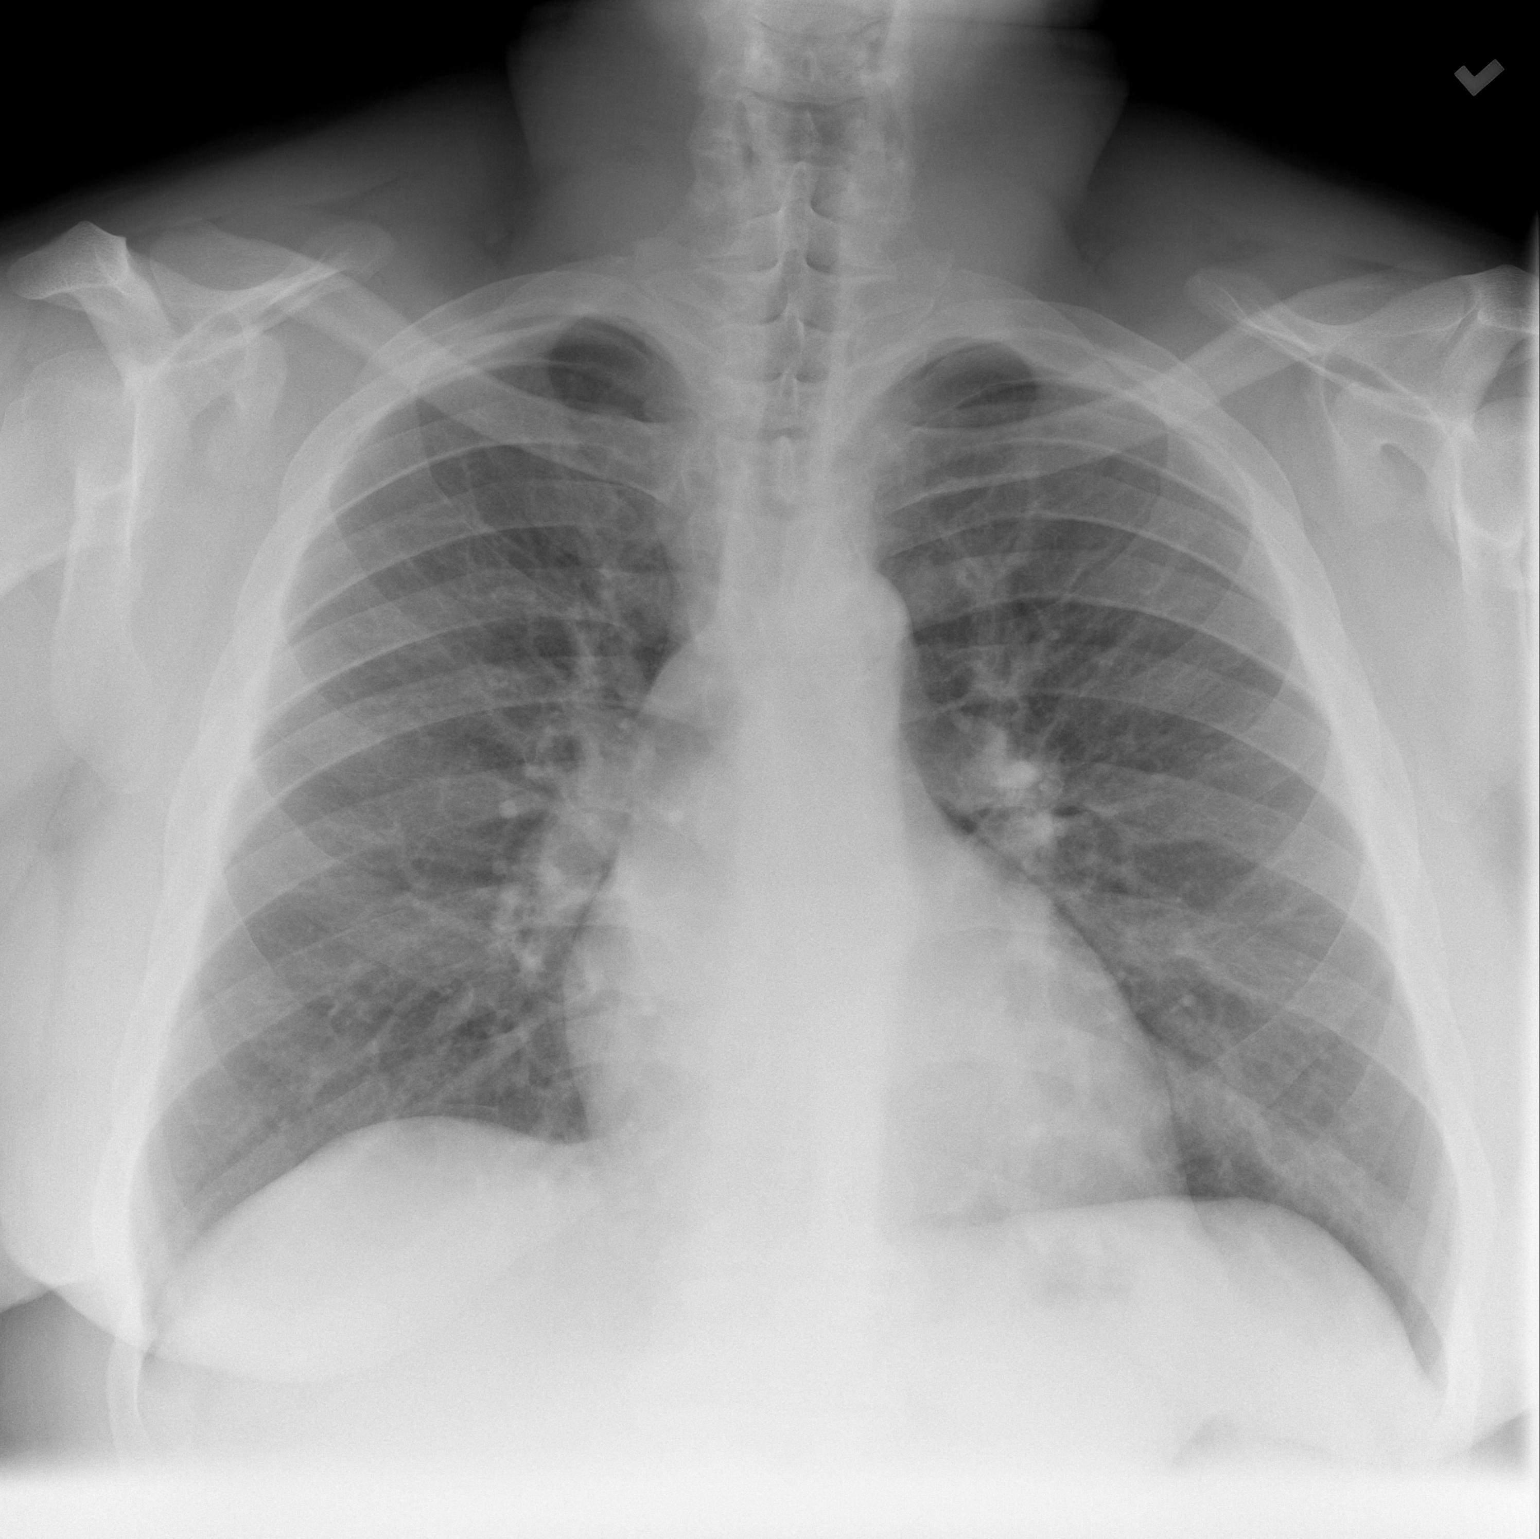

[w chest lat]
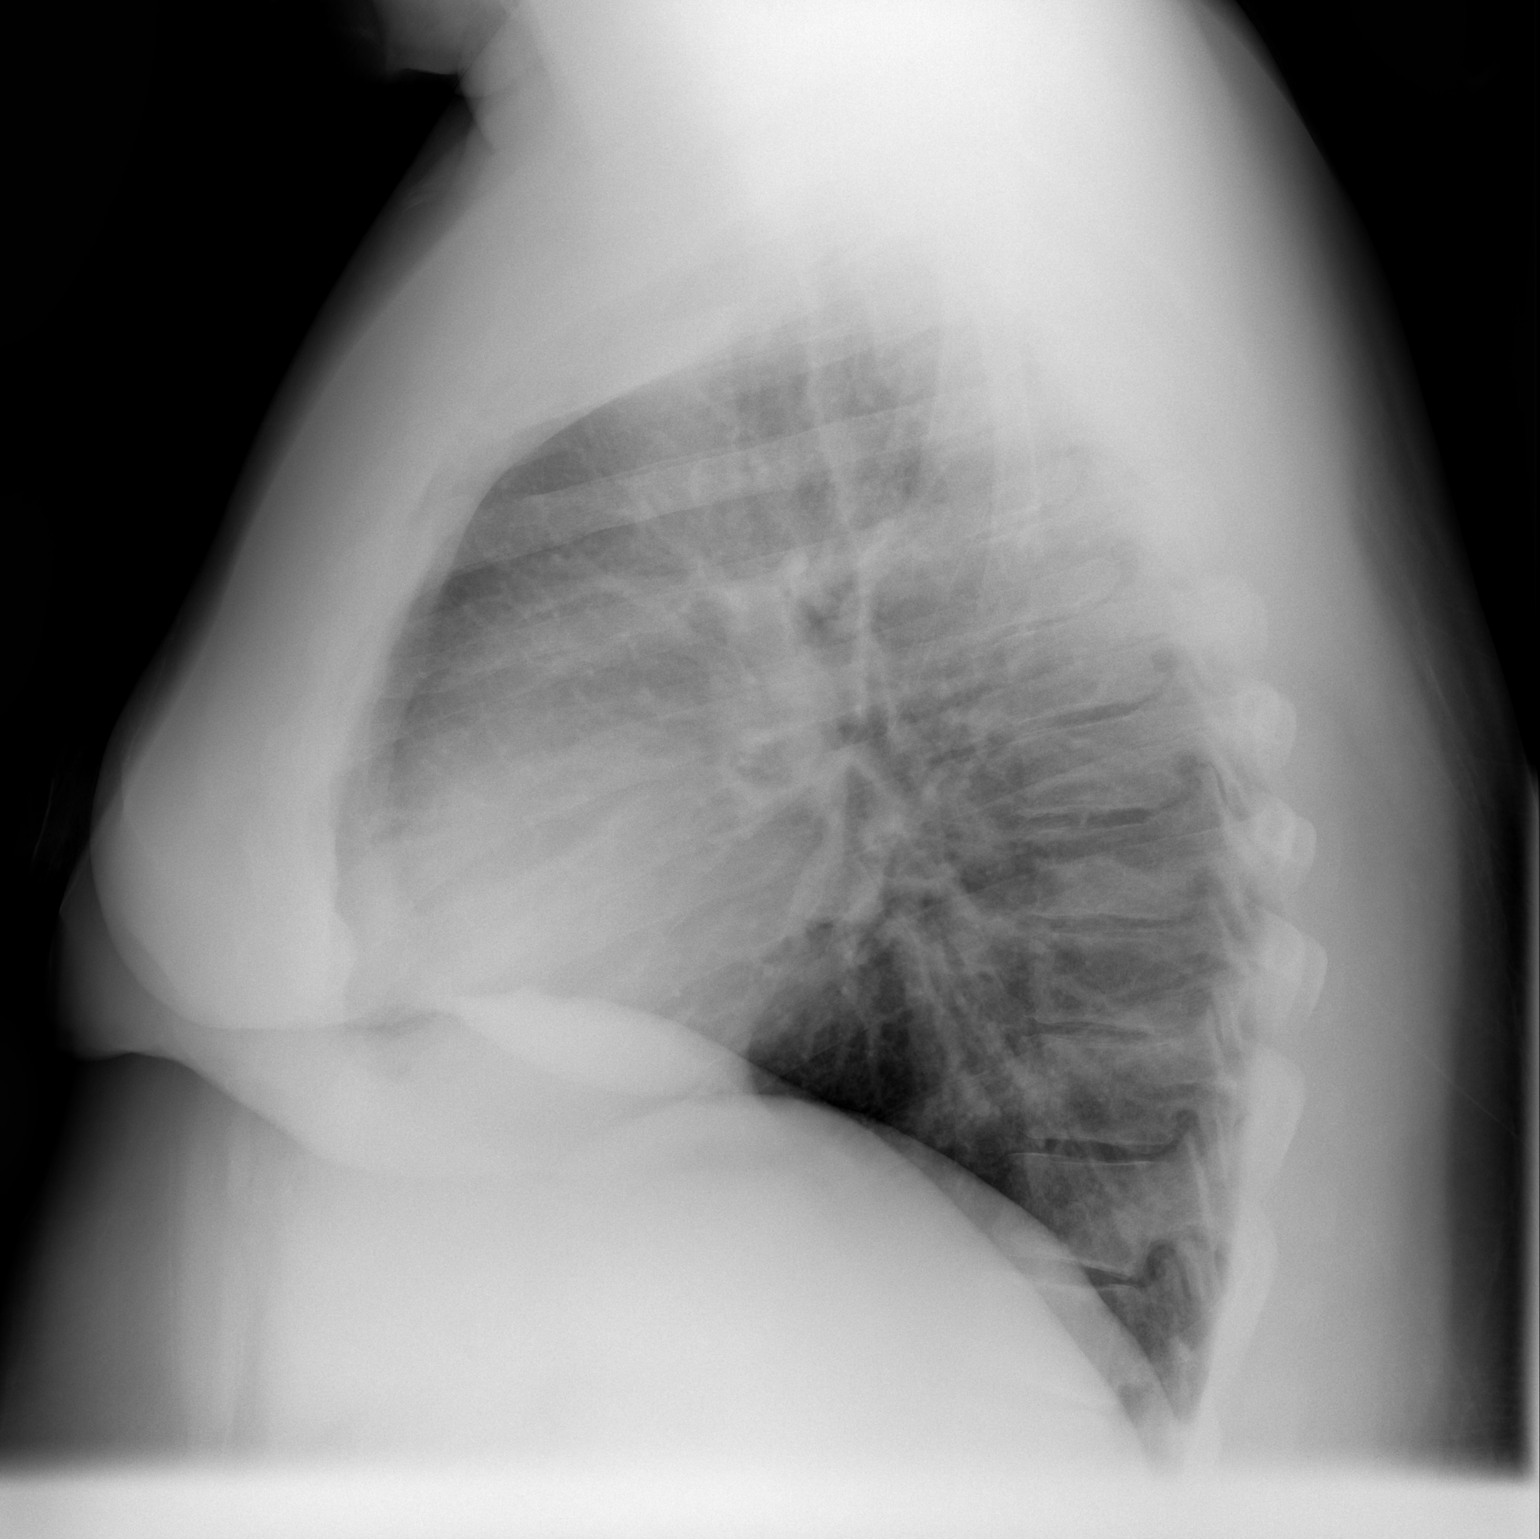

[2 of 2 positions shown; findings below may reference images not displayed]

FINDINGS: The heart size and mediastinal contours are within normal limits.
There is no evidence of pulmonary edema, consolidation,
pneumothorax, nodule or pleural fluid. The visualized skeletal
structures are unremarkable.
IMPRESSION: No active cardiopulmonary disease.

## 2023-10-03 ENCOUNTER — Emergency Department (HOSPITAL_BASED_OUTPATIENT_CLINIC_OR_DEPARTMENT_OTHER): Payer: Medicaid Other

## 2023-10-03 ENCOUNTER — Other Ambulatory Visit: Payer: Self-pay

## 2023-10-03 ENCOUNTER — Encounter (HOSPITAL_BASED_OUTPATIENT_CLINIC_OR_DEPARTMENT_OTHER): Payer: Self-pay | Admitting: Emergency Medicine

## 2023-10-03 ENCOUNTER — Emergency Department (HOSPITAL_BASED_OUTPATIENT_CLINIC_OR_DEPARTMENT_OTHER)
Admission: EM | Admit: 2023-10-03 | Discharge: 2023-10-03 | Disposition: A | Discharge status: Home / Self Care | Payer: Medicaid Other | Attending: Emergency Medicine | Admitting: Emergency Medicine

## 2023-10-03 DIAGNOSIS — Y9301 Activity, walking, marching and hiking: Secondary | ICD-10-CM | POA: Insufficient documentation

## 2023-10-03 DIAGNOSIS — I10 Essential (primary) hypertension: Secondary | ICD-10-CM | POA: Insufficient documentation

## 2023-10-03 DIAGNOSIS — E119 Type 2 diabetes mellitus without complications: Secondary | ICD-10-CM | POA: Insufficient documentation

## 2023-10-03 DIAGNOSIS — W109XXA Fall (on) (from) unspecified stairs and steps, initial encounter: Secondary | ICD-10-CM | POA: Insufficient documentation

## 2023-10-03 DIAGNOSIS — M25512 Pain in left shoulder: Secondary | ICD-10-CM | POA: Diagnosis not present

## 2023-10-03 DIAGNOSIS — W19XXXA Unspecified fall, initial encounter: Secondary | ICD-10-CM

## 2023-10-03 DIAGNOSIS — M542 Cervicalgia: Secondary | ICD-10-CM | POA: Diagnosis present

## 2023-10-03 DIAGNOSIS — G8911 Acute pain due to trauma: Secondary | ICD-10-CM | POA: Diagnosis not present

## 2023-10-03 DIAGNOSIS — S139XXA Sprain of joints and ligaments of unspecified parts of neck, initial encounter: Secondary | ICD-10-CM | POA: Insufficient documentation

## 2023-10-03 DIAGNOSIS — M79662 Pain in left lower leg: Secondary | ICD-10-CM | POA: Insufficient documentation

## 2023-10-03 DIAGNOSIS — M79605 Pain in left leg: Secondary | ICD-10-CM

## 2023-10-03 MED ORDER — METHOCARBAMOL 500 MG PO TABS: 500.0000 mg | ORAL_TABLET | Freq: Three times a day (TID) | ORAL | 0 refills | Status: AC | PRN

## 2023-10-03 MED ORDER — DICLOFENAC SODIUM 1 % EX GEL: 2.0000 g | Freq: Four times a day (QID) | CUTANEOUS | 0 refills | Status: AC | PRN

## 2023-10-03 MED ORDER — KETOROLAC TROMETHAMINE 30 MG/ML IJ SOLN
30.0000 mg | Freq: Once | INTRAMUSCULAR | Status: AC
  Administered 2023-10-03: 30 mg via INTRAMUSCULAR
  Filled 2023-10-03: qty 1

## 2023-10-03 NOTE — ED Provider Notes (Signed)
 Emergency Department Provider Note   I have reviewed the triage vital signs and the nursing notes.   HISTORY  Chief Complaint Fall   HPI Christopher Poole is a 48 y.o. male past history of hypercholesterolemia and diabetes presents to the emergency department for evaluation after fall last night.  Patient states he was walking down some unfamiliar steps when he missed a step falling ultimately down 4 steps.  Unsure regarding head trauma but did strike his left shoulder where most of his pain is located.  He is having some associated pain into his left neck and left hip/knee.  No numbness or weakness.  Walking is difficult due to pain but he is able to do so.  He is able to move his shoulder but requires some assistance due to pain.  Feels a burning sensation over the shoulder but no numbness.  No vision changes or speech disturbance.  He is not anticoagulated.   Past Medical History:  Diagnosis Date   Diabetes mellitus without complication (HCC)    Hypercholesterolemia     Review of Systems  Constitutional: No fever/chills Cardiovascular: Denies chest pain. Respiratory: Denies shortness of breath. Gastrointestinal: No abdominal pain.  No nausea, no vomiting.   Musculoskeletal: Pain in the left shoulder and left leg.  Skin: Negative for rash. Neurological: Positive HA. No numbness/weakness.   ____________________________________________   PHYSICAL EXAM:  VITAL SIGNS: ED Triage Vitals  Encounter Vitals Group     BP 10/03/23 1657 135/80     Pulse Rate 10/03/23 1657 77     Resp 10/03/23 1657 18     Temp 10/03/23 1657 98.2 F (36.8 C)     Temp src --      SpO2 10/03/23 1657 96 %     Weight 10/03/23 1657 (!) 341 lb (154.7 kg)     Height 10/03/23 1657 5' 7.5" (1.715 m)   Constitutional: Alert and oriented. Well appearing and in no acute distress. Eyes: Conjunctivae are normal. PERRL.  Head: Atraumatic. Nose: No congestion/rhinnorhea. Mouth/Throat: Mucous membranes are  moist.  Oropharynx non-erythematous. Neck: No stridor. No cervical spine tenderness to palpation. C collar in place.  Cardiovascular: Normal rate, regular rhythm. Good peripheral circulation. Grossly normal heart sounds.   Respiratory: Normal respiratory effort.  No retractions. Lungs CTAB. Gastrointestinal: Soft and nontender. No distention.  Musculoskeletal: Normal range of motion of the bilateral hips and knees with some discomfort to palpation over the left patella.  No large effusion or bruising.  Compartments are soft.  Decreased range of motion of the left shoulder secondary to pain without large effusion or bruising.  No tenderness to the ankle or wrist bilaterally. Neurologic:  Normal speech and language. No gross focal neurologic deficits are appreciated.  Skin:  Skin is warm, dry and intact. No rash noted. _______________________________  RADIOLOGY  No results found.  ____________________________________________   PROCEDURES  Procedure(s) performed:   Procedures  None  ____________________________________________   INITIAL IMPRESSION / ASSESSMENT AND PLAN / ED COURSE  Pertinent labs & imaging results that were available during my care of the patient were reviewed by me and considered in my medical decision making (see chart for details).   This patient is Presenting for Evaluation of fall, which does require a range of treatment options, and is a complaint that involves a high risk of morbidity and mortality.  The Differential Diagnoses includes subdural hematoma, epidural hematoma, acute concussion, traumatic subarachnoid hemorrhage, cerebral contusions, etc.   Critical Interventions-    Medications  ketorolac (TORADOL) 30 MG/ML injection 30 mg (30 mg Intramuscular Given 10/03/23 1823)    Reassessment after intervention: pain improved.    I did obtain Additional Historical Information from family at bedside.  Radiologic Tests Ordered, included CT head, c  spine, . I independently interpreted the images and agree with radiology interpretation. XR knee, hip, and shoulder.   Cardiac Monitor Tracing which shows ***   Social Determinants of Health Risk ***  Consult complete with  Medical Decision Making: Summary: ***  Reevaluation with update and discussion with   ***Considered admission***  Patient's presentation is most consistent with {EM COPA:27473}   Disposition:   ____________________________________________  FINAL CLINICAL IMPRESSION(S) / ED DIAGNOSES  Final diagnoses:  None     NEW OUTPATIENT MEDICATIONS STARTED DURING THIS VISIT:  New Prescriptions   No medications on file    Note:  This document was prepared using Dragon voice recognition software and may include unintentional dictation errors.  Alona Bene, MD, [facility]

## 2023-10-03 NOTE — Discharge Instructions (Signed)

## 2023-10-03 NOTE — ED Triage Notes (Addendum)
 Pt arrived to triage via wheelchair.  He was walking down steps last night.  Pt mis-stepped and fell down 4 steps.  Pt c/o left sided neck and left shoulder pain.  Also having left hip and knee pain.
